# Patient Record
Sex: Male | Born: 1955 | Race: Black or African American | Hispanic: No | Marital: Single | Smoking: Never smoker
Health system: Southern US, Community
[De-identification: ages and names within clinical notes are randomized; demographics above are authoritative.]

## PROBLEM LIST (undated history)

## (undated) DIAGNOSIS — B009 Herpesviral infection, unspecified: Secondary | ICD-10-CM

## (undated) DIAGNOSIS — E785 Hyperlipidemia, unspecified: Secondary | ICD-10-CM

## (undated) DIAGNOSIS — N529 Male erectile dysfunction, unspecified: Secondary | ICD-10-CM

## (undated) HISTORY — DX: Hyperlipidemia, unspecified: E78.5

## (undated) HISTORY — DX: Male erectile dysfunction, unspecified: N52.9

## (undated) HISTORY — DX: Herpesviral infection, unspecified: B00.9

---

## 1981-07-04 HISTORY — PX: ARM WOUND REPAIR / CLOSURE: SUR1141

## 1997-07-19 ENCOUNTER — Encounter: Admission: RE | Admit: 1997-07-19 | Discharge: 1997-10-17 | Payer: Self-pay | Admitting: Family Medicine

## 2009-01-08 ENCOUNTER — Encounter: Admission: RE | Admit: 2009-01-08 | Discharge: 2009-01-08 | Payer: Self-pay | Admitting: Family Medicine

## 2011-01-20 IMAGING — CR DG LUMBAR SPINE COMPLETE 4+V
5 series · 5 of 5 positions shown · non-contrast
Comparison: None

CLINICAL DATA: Low back pain, no trauma

LUMBAR SPINE - COMPLETE 4+ VIEW

[view not recorded (1 of 5)]
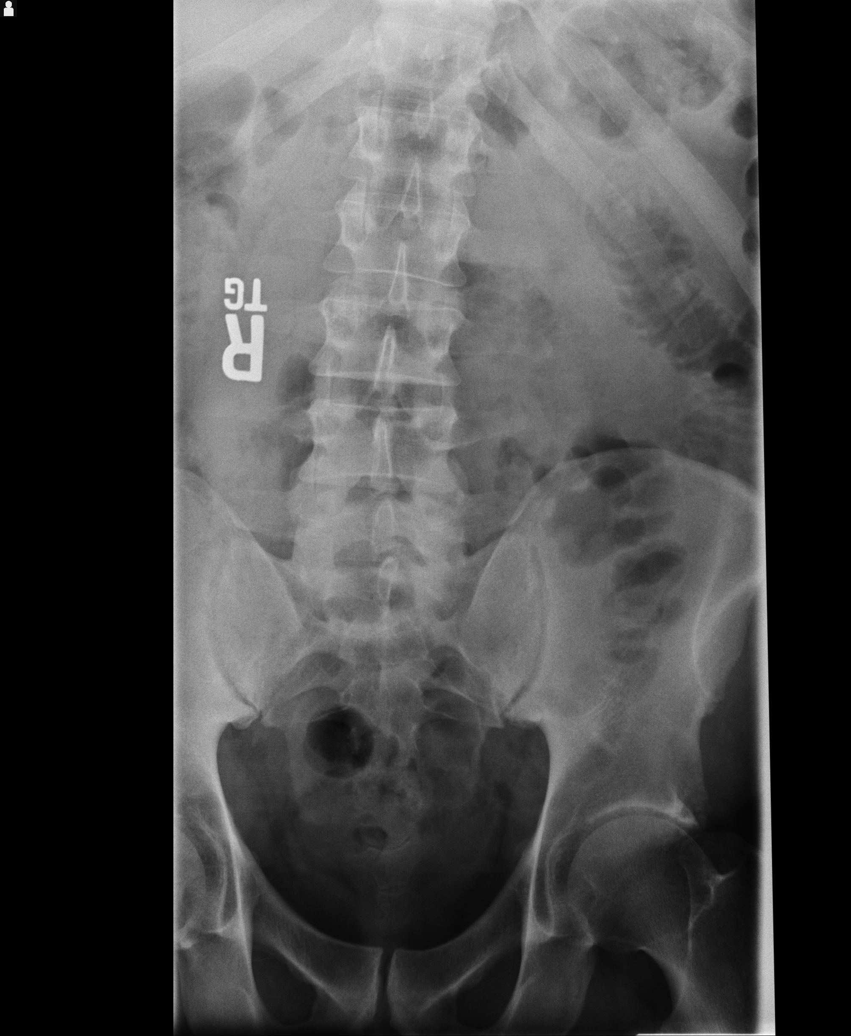

[view not recorded (2 of 5)]
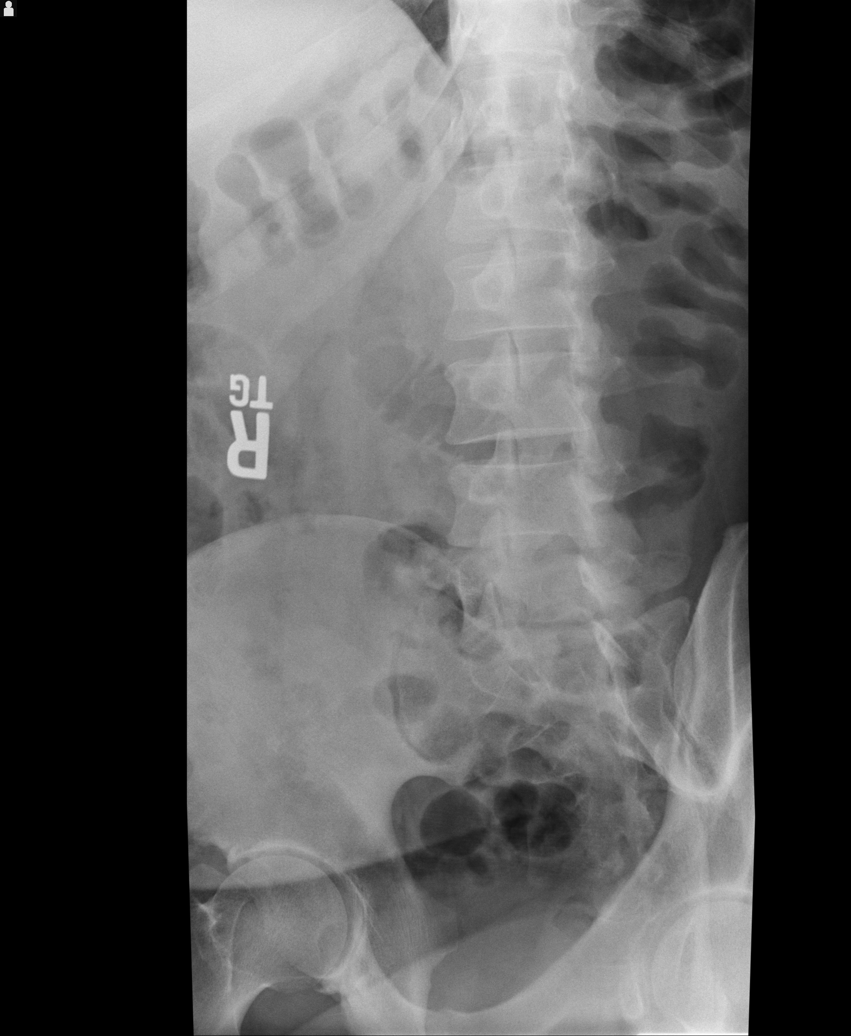

[view not recorded (3 of 5)]
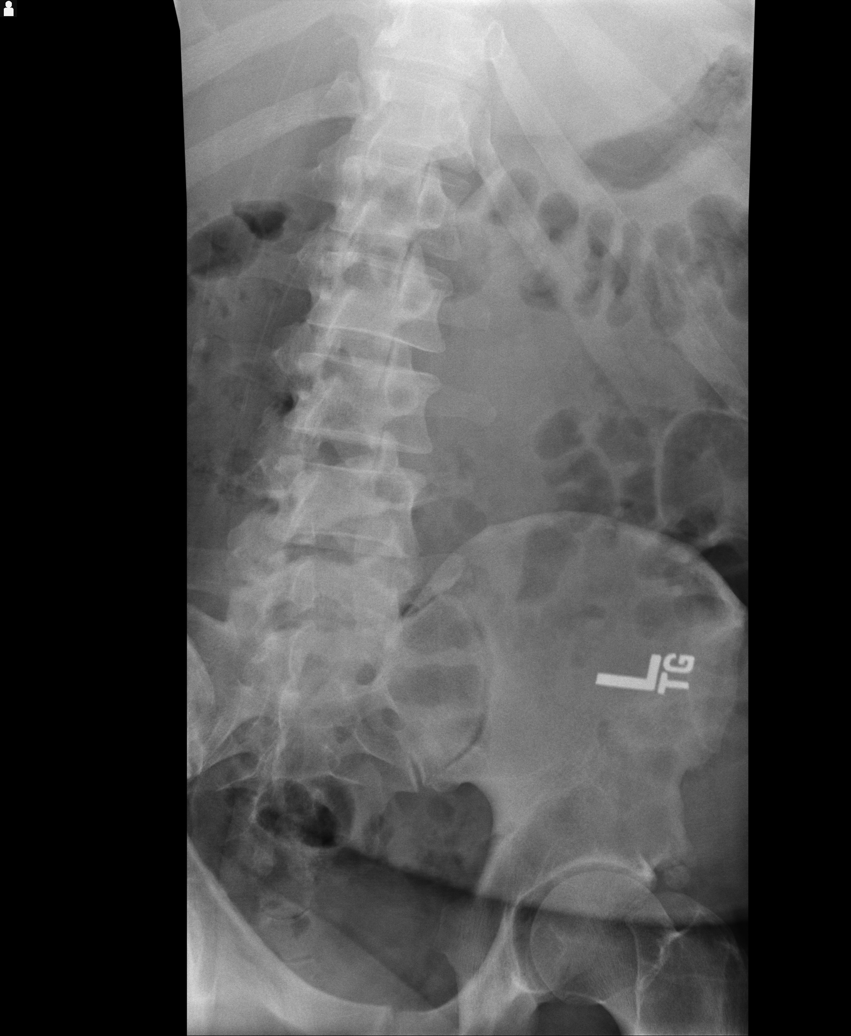

[view not recorded (4 of 5)]
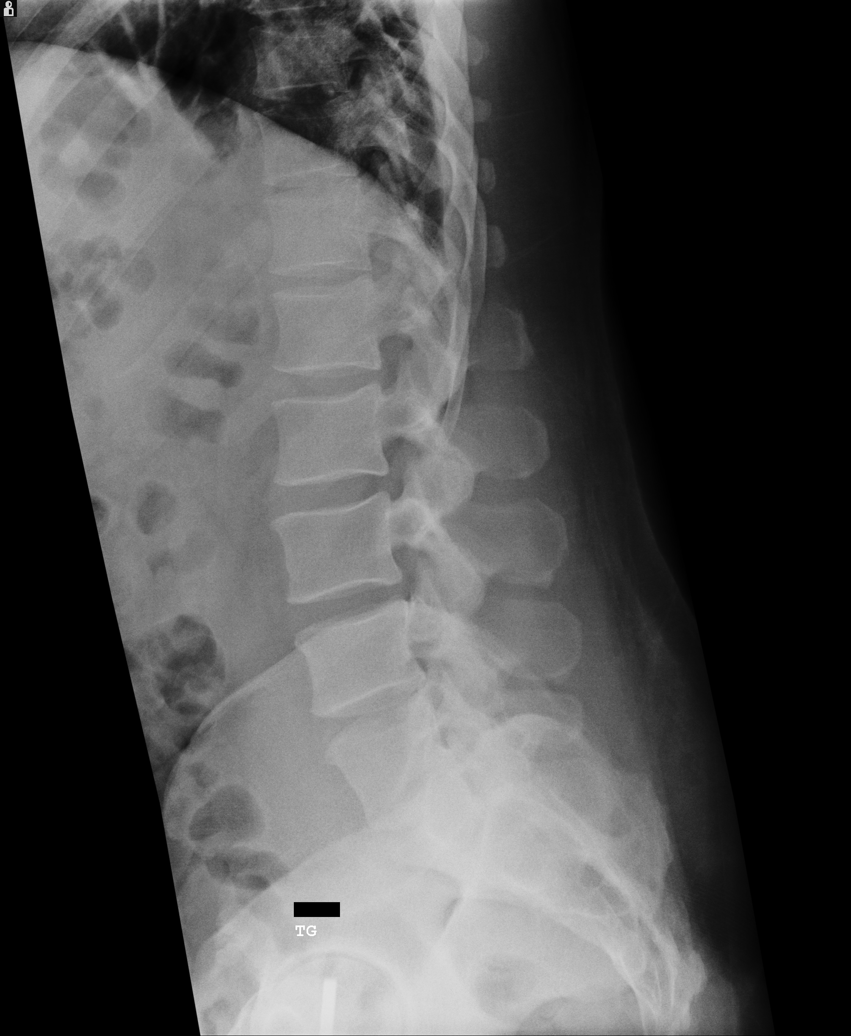

[view not recorded (5 of 5)]
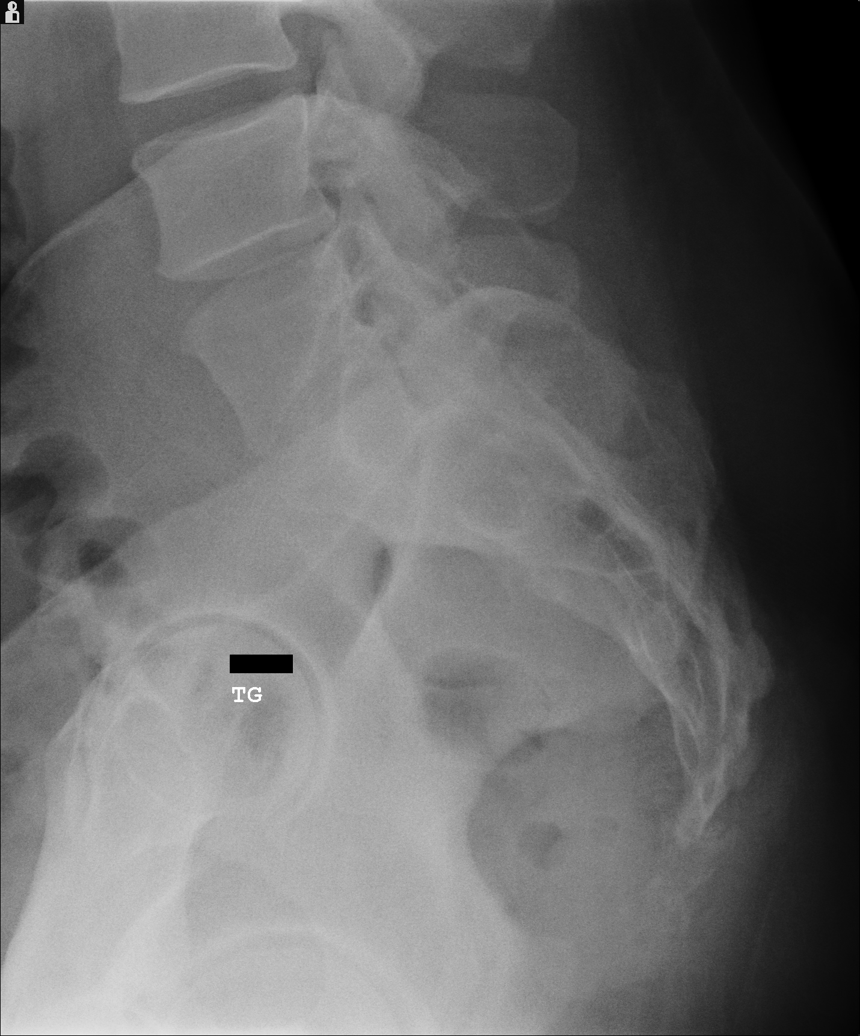

[5 of 5 positions shown; findings below may reference images not displayed]

FINDINGS: There is no evidence of lumbar spine fracture.  Alignment
is normal.  Intervertebral disc spaces are maintained.
IMPRESSION: Negative.

## 2014-01-29 ENCOUNTER — Encounter: Payer: Self-pay | Admitting: *Deleted

## 2015-09-30 DIAGNOSIS — G8929 Other chronic pain: Secondary | ICD-10-CM | POA: Diagnosis not present

## 2015-09-30 DIAGNOSIS — E785 Hyperlipidemia, unspecified: Secondary | ICD-10-CM | POA: Diagnosis not present

## 2015-09-30 DIAGNOSIS — N529 Male erectile dysfunction, unspecified: Secondary | ICD-10-CM | POA: Diagnosis not present

## 2015-09-30 DIAGNOSIS — M545 Low back pain: Secondary | ICD-10-CM | POA: Diagnosis not present

## 2016-02-21 DIAGNOSIS — R202 Paresthesia of skin: Secondary | ICD-10-CM | POA: Diagnosis not present

## 2016-02-21 DIAGNOSIS — M25511 Pain in right shoulder: Secondary | ICD-10-CM | POA: Diagnosis not present

## 2016-03-09 DIAGNOSIS — M7581 Other shoulder lesions, right shoulder: Secondary | ICD-10-CM | POA: Diagnosis not present

## 2016-03-31 DIAGNOSIS — N529 Male erectile dysfunction, unspecified: Secondary | ICD-10-CM | POA: Diagnosis not present

## 2016-03-31 DIAGNOSIS — E785 Hyperlipidemia, unspecified: Secondary | ICD-10-CM | POA: Diagnosis not present

## 2016-03-31 DIAGNOSIS — Z0000 Encounter for general adult medical examination without abnormal findings: Secondary | ICD-10-CM | POA: Diagnosis not present

## 2016-03-31 DIAGNOSIS — Z125 Encounter for screening for malignant neoplasm of prostate: Secondary | ICD-10-CM | POA: Diagnosis not present

## 2016-03-31 DIAGNOSIS — M7581 Other shoulder lesions, right shoulder: Secondary | ICD-10-CM | POA: Diagnosis not present

## 2016-03-31 DIAGNOSIS — Z23 Encounter for immunization: Secondary | ICD-10-CM | POA: Diagnosis not present

## 2016-03-31 DIAGNOSIS — M545 Low back pain: Secondary | ICD-10-CM | POA: Diagnosis not present

## 2016-03-31 DIAGNOSIS — Z1211 Encounter for screening for malignant neoplasm of colon: Secondary | ICD-10-CM | POA: Diagnosis not present

## 2016-04-14 DIAGNOSIS — M4692 Unspecified inflammatory spondylopathy, cervical region: Secondary | ICD-10-CM | POA: Diagnosis not present

## 2016-04-17 DIAGNOSIS — M47892 Other spondylosis, cervical region: Secondary | ICD-10-CM | POA: Diagnosis not present

## 2016-04-23 DIAGNOSIS — M47892 Other spondylosis, cervical region: Secondary | ICD-10-CM | POA: Diagnosis not present

## 2016-04-28 DIAGNOSIS — M47892 Other spondylosis, cervical region: Secondary | ICD-10-CM | POA: Diagnosis not present

## 2016-04-30 DIAGNOSIS — M47892 Other spondylosis, cervical region: Secondary | ICD-10-CM | POA: Diagnosis not present

## 2016-05-05 DIAGNOSIS — M47892 Other spondylosis, cervical region: Secondary | ICD-10-CM | POA: Diagnosis not present

## 2016-05-12 DIAGNOSIS — M47892 Other spondylosis, cervical region: Secondary | ICD-10-CM | POA: Diagnosis not present

## 2016-05-13 DIAGNOSIS — M542 Cervicalgia: Secondary | ICD-10-CM | POA: Diagnosis not present

## 2016-05-14 DIAGNOSIS — M47892 Other spondylosis, cervical region: Secondary | ICD-10-CM | POA: Diagnosis not present

## 2016-05-19 DIAGNOSIS — M542 Cervicalgia: Secondary | ICD-10-CM | POA: Diagnosis not present

## 2016-06-22 DIAGNOSIS — M5412 Radiculopathy, cervical region: Secondary | ICD-10-CM | POA: Diagnosis not present

## 2016-06-29 DIAGNOSIS — M5412 Radiculopathy, cervical region: Secondary | ICD-10-CM | POA: Diagnosis not present

## 2016-07-14 DIAGNOSIS — M5412 Radiculopathy, cervical region: Secondary | ICD-10-CM | POA: Diagnosis not present

## 2016-09-29 DIAGNOSIS — E7800 Pure hypercholesterolemia, unspecified: Secondary | ICD-10-CM | POA: Diagnosis not present

## 2016-09-29 DIAGNOSIS — M545 Low back pain: Secondary | ICD-10-CM | POA: Diagnosis not present

## 2016-09-29 DIAGNOSIS — N529 Male erectile dysfunction, unspecified: Secondary | ICD-10-CM | POA: Diagnosis not present

## 2016-11-03 DIAGNOSIS — R030 Elevated blood-pressure reading, without diagnosis of hypertension: Secondary | ICD-10-CM | POA: Diagnosis not present

## 2016-11-03 DIAGNOSIS — E7800 Pure hypercholesterolemia, unspecified: Secondary | ICD-10-CM | POA: Diagnosis not present

## 2016-11-03 DIAGNOSIS — M545 Low back pain: Secondary | ICD-10-CM | POA: Diagnosis not present

## 2016-11-03 DIAGNOSIS — N529 Male erectile dysfunction, unspecified: Secondary | ICD-10-CM | POA: Diagnosis not present

## 2017-05-06 DIAGNOSIS — M545 Low back pain: Secondary | ICD-10-CM | POA: Diagnosis not present

## 2017-05-06 DIAGNOSIS — Z23 Encounter for immunization: Secondary | ICD-10-CM | POA: Diagnosis not present

## 2017-05-06 DIAGNOSIS — N529 Male erectile dysfunction, unspecified: Secondary | ICD-10-CM | POA: Diagnosis not present

## 2017-05-06 DIAGNOSIS — E7800 Pure hypercholesterolemia, unspecified: Secondary | ICD-10-CM | POA: Diagnosis not present

## 2017-09-14 DIAGNOSIS — J069 Acute upper respiratory infection, unspecified: Secondary | ICD-10-CM | POA: Diagnosis not present

## 2017-09-14 DIAGNOSIS — S20361A Insect bite (nonvenomous) of right front wall of thorax, initial encounter: Secondary | ICD-10-CM | POA: Diagnosis not present

## 2017-12-06 DIAGNOSIS — M545 Low back pain: Secondary | ICD-10-CM | POA: Diagnosis not present

## 2017-12-06 DIAGNOSIS — M5432 Sciatica, left side: Secondary | ICD-10-CM | POA: Diagnosis not present

## 2017-12-06 DIAGNOSIS — N529 Male erectile dysfunction, unspecified: Secondary | ICD-10-CM | POA: Diagnosis not present

## 2017-12-06 DIAGNOSIS — Z0000 Encounter for general adult medical examination without abnormal findings: Secondary | ICD-10-CM | POA: Diagnosis not present

## 2017-12-06 DIAGNOSIS — E7800 Pure hypercholesterolemia, unspecified: Secondary | ICD-10-CM | POA: Diagnosis not present

## 2017-12-06 DIAGNOSIS — Z125 Encounter for screening for malignant neoplasm of prostate: Secondary | ICD-10-CM | POA: Diagnosis not present

## 2018-06-10 DIAGNOSIS — E7800 Pure hypercholesterolemia, unspecified: Secondary | ICD-10-CM | POA: Diagnosis not present

## 2018-06-10 DIAGNOSIS — N529 Male erectile dysfunction, unspecified: Secondary | ICD-10-CM | POA: Diagnosis not present

## 2018-06-10 DIAGNOSIS — Z1211 Encounter for screening for malignant neoplasm of colon: Secondary | ICD-10-CM | POA: Diagnosis not present

## 2018-06-10 DIAGNOSIS — M545 Low back pain: Secondary | ICD-10-CM | POA: Diagnosis not present

## 2018-06-17 DIAGNOSIS — Z1211 Encounter for screening for malignant neoplasm of colon: Secondary | ICD-10-CM | POA: Diagnosis not present

## 2018-07-07 DIAGNOSIS — Z1211 Encounter for screening for malignant neoplasm of colon: Secondary | ICD-10-CM | POA: Diagnosis not present

## 2018-09-12 DIAGNOSIS — Z01818 Encounter for other preprocedural examination: Secondary | ICD-10-CM | POA: Diagnosis not present

## 2018-09-12 DIAGNOSIS — K635 Polyp of colon: Secondary | ICD-10-CM | POA: Diagnosis not present

## 2018-09-12 DIAGNOSIS — Z1211 Encounter for screening for malignant neoplasm of colon: Secondary | ICD-10-CM | POA: Diagnosis not present

## 2018-09-21 DIAGNOSIS — K635 Polyp of colon: Secondary | ICD-10-CM | POA: Diagnosis not present

## 2018-10-13 DIAGNOSIS — K648 Other hemorrhoids: Secondary | ICD-10-CM | POA: Diagnosis not present

## 2018-10-13 DIAGNOSIS — D126 Benign neoplasm of colon, unspecified: Secondary | ICD-10-CM | POA: Diagnosis not present

## 2018-10-25 ENCOUNTER — Other Ambulatory Visit: Payer: Self-pay | Admitting: *Deleted

## 2018-10-25 DIAGNOSIS — Z20822 Contact with and (suspected) exposure to covid-19: Secondary | ICD-10-CM

## 2018-10-31 LAB — NOVEL CORONAVIRUS, NAA: SARS-CoV-2, NAA: NOT DETECTED

## 2018-11-03 ENCOUNTER — Telehealth: Payer: Self-pay | Admitting: General Practice

## 2018-11-03 NOTE — Telephone Encounter (Signed)
Patient informed of negative test results for COVID.  

## 2018-11-13 DIAGNOSIS — R58 Hemorrhage, not elsewhere classified: Secondary | ICD-10-CM | POA: Diagnosis not present

## 2018-11-26 DIAGNOSIS — S61411A Laceration without foreign body of right hand, initial encounter: Secondary | ICD-10-CM | POA: Diagnosis not present

## 2018-12-08 DIAGNOSIS — Z0000 Encounter for general adult medical examination without abnormal findings: Secondary | ICD-10-CM | POA: Diagnosis not present

## 2018-12-08 DIAGNOSIS — S61411S Laceration without foreign body of right hand, sequela: Secondary | ICD-10-CM | POA: Diagnosis not present

## 2018-12-08 DIAGNOSIS — Z125 Encounter for screening for malignant neoplasm of prostate: Secondary | ICD-10-CM | POA: Diagnosis not present

## 2018-12-08 DIAGNOSIS — M545 Low back pain: Secondary | ICD-10-CM | POA: Diagnosis not present

## 2018-12-08 DIAGNOSIS — E7800 Pure hypercholesterolemia, unspecified: Secondary | ICD-10-CM | POA: Diagnosis not present

## 2018-12-08 DIAGNOSIS — N529 Male erectile dysfunction, unspecified: Secondary | ICD-10-CM | POA: Diagnosis not present

## 2019-02-03 DIAGNOSIS — S91311A Laceration without foreign body, right foot, initial encounter: Secondary | ICD-10-CM | POA: Diagnosis not present

## 2019-02-10 DIAGNOSIS — Z23 Encounter for immunization: Secondary | ICD-10-CM | POA: Diagnosis not present

## 2019-05-02 DIAGNOSIS — Z20822 Contact with and (suspected) exposure to covid-19: Secondary | ICD-10-CM | POA: Diagnosis not present

## 2019-05-02 DIAGNOSIS — U071 COVID-19: Secondary | ICD-10-CM | POA: Diagnosis not present

## 2019-05-02 DIAGNOSIS — Z1152 Encounter for screening for COVID-19: Secondary | ICD-10-CM | POA: Diagnosis not present

## 2019-05-04 DIAGNOSIS — U071 COVID-19: Secondary | ICD-10-CM | POA: Diagnosis not present

## 2019-05-04 DIAGNOSIS — Z9189 Other specified personal risk factors, not elsewhere classified: Secondary | ICD-10-CM | POA: Diagnosis not present

## 2019-05-11 DIAGNOSIS — Z20822 Contact with and (suspected) exposure to covid-19: Secondary | ICD-10-CM | POA: Diagnosis not present

## 2019-06-12 DIAGNOSIS — M545 Low back pain: Secondary | ICD-10-CM | POA: Diagnosis not present

## 2019-06-12 DIAGNOSIS — E7800 Pure hypercholesterolemia, unspecified: Secondary | ICD-10-CM | POA: Diagnosis not present

## 2019-06-12 DIAGNOSIS — N529 Male erectile dysfunction, unspecified: Secondary | ICD-10-CM | POA: Diagnosis not present

## 2019-08-01 DIAGNOSIS — E7800 Pure hypercholesterolemia, unspecified: Secondary | ICD-10-CM | POA: Diagnosis not present

## 2019-12-11 DIAGNOSIS — E7800 Pure hypercholesterolemia, unspecified: Secondary | ICD-10-CM | POA: Diagnosis not present

## 2019-12-11 DIAGNOSIS — Z125 Encounter for screening for malignant neoplasm of prostate: Secondary | ICD-10-CM | POA: Diagnosis not present

## 2019-12-11 DIAGNOSIS — Z0000 Encounter for general adult medical examination without abnormal findings: Secondary | ICD-10-CM | POA: Diagnosis not present

## 2020-04-26 DIAGNOSIS — R059 Cough, unspecified: Secondary | ICD-10-CM | POA: Diagnosis not present

## 2020-04-26 DIAGNOSIS — M545 Low back pain, unspecified: Secondary | ICD-10-CM | POA: Diagnosis not present

## 2020-04-26 DIAGNOSIS — U071 COVID-19: Secondary | ICD-10-CM | POA: Diagnosis not present

## 2020-06-13 DIAGNOSIS — N529 Male erectile dysfunction, unspecified: Secondary | ICD-10-CM | POA: Diagnosis not present

## 2020-06-13 DIAGNOSIS — M545 Low back pain, unspecified: Secondary | ICD-10-CM | POA: Diagnosis not present

## 2020-06-13 DIAGNOSIS — E7800 Pure hypercholesterolemia, unspecified: Secondary | ICD-10-CM | POA: Diagnosis not present

## 2020-06-13 DIAGNOSIS — Z8616 Personal history of COVID-19: Secondary | ICD-10-CM | POA: Diagnosis not present

## 2020-12-13 DIAGNOSIS — N529 Male erectile dysfunction, unspecified: Secondary | ICD-10-CM | POA: Diagnosis not present

## 2020-12-13 DIAGNOSIS — Z125 Encounter for screening for malignant neoplasm of prostate: Secondary | ICD-10-CM | POA: Diagnosis not present

## 2020-12-13 DIAGNOSIS — Z0000 Encounter for general adult medical examination without abnormal findings: Secondary | ICD-10-CM | POA: Diagnosis not present

## 2020-12-13 DIAGNOSIS — M1710 Unilateral primary osteoarthritis, unspecified knee: Secondary | ICD-10-CM | POA: Diagnosis not present

## 2020-12-13 DIAGNOSIS — M545 Low back pain, unspecified: Secondary | ICD-10-CM | POA: Diagnosis not present

## 2020-12-13 DIAGNOSIS — E7800 Pure hypercholesterolemia, unspecified: Secondary | ICD-10-CM | POA: Diagnosis not present

## 2021-06-20 DIAGNOSIS — E7800 Pure hypercholesterolemia, unspecified: Secondary | ICD-10-CM | POA: Diagnosis not present

## 2021-06-20 DIAGNOSIS — M545 Low back pain, unspecified: Secondary | ICD-10-CM | POA: Diagnosis not present

## 2021-06-20 DIAGNOSIS — M1712 Unilateral primary osteoarthritis, left knee: Secondary | ICD-10-CM | POA: Diagnosis not present

## 2021-06-20 DIAGNOSIS — N529 Male erectile dysfunction, unspecified: Secondary | ICD-10-CM | POA: Diagnosis not present

## 2022-01-02 DIAGNOSIS — M1712 Unilateral primary osteoarthritis, left knee: Secondary | ICD-10-CM | POA: Diagnosis not present

## 2022-01-02 DIAGNOSIS — Z125 Encounter for screening for malignant neoplasm of prostate: Secondary | ICD-10-CM | POA: Diagnosis not present

## 2022-01-02 DIAGNOSIS — N529 Male erectile dysfunction, unspecified: Secondary | ICD-10-CM | POA: Diagnosis not present

## 2022-01-02 DIAGNOSIS — M1711 Unilateral primary osteoarthritis, right knee: Secondary | ICD-10-CM | POA: Diagnosis not present

## 2022-01-02 DIAGNOSIS — E7800 Pure hypercholesterolemia, unspecified: Secondary | ICD-10-CM | POA: Diagnosis not present

## 2022-01-02 DIAGNOSIS — Z0000 Encounter for general adult medical examination without abnormal findings: Secondary | ICD-10-CM | POA: Diagnosis not present

## 2022-01-02 DIAGNOSIS — M545 Low back pain, unspecified: Secondary | ICD-10-CM | POA: Diagnosis not present

## 2022-02-20 DIAGNOSIS — M25561 Pain in right knee: Secondary | ICD-10-CM | POA: Diagnosis not present

## 2022-02-20 DIAGNOSIS — M25562 Pain in left knee: Secondary | ICD-10-CM | POA: Diagnosis not present

## 2022-02-20 DIAGNOSIS — M170 Bilateral primary osteoarthritis of knee: Secondary | ICD-10-CM | POA: Diagnosis not present

## 2022-07-02 DIAGNOSIS — N529 Male erectile dysfunction, unspecified: Secondary | ICD-10-CM | POA: Diagnosis not present

## 2022-07-02 DIAGNOSIS — E7800 Pure hypercholesterolemia, unspecified: Secondary | ICD-10-CM | POA: Diagnosis not present

## 2022-07-02 DIAGNOSIS — M545 Low back pain, unspecified: Secondary | ICD-10-CM | POA: Diagnosis not present

## 2022-07-02 DIAGNOSIS — M1710 Unilateral primary osteoarthritis, unspecified knee: Secondary | ICD-10-CM | POA: Diagnosis not present

## 2023-01-15 DIAGNOSIS — N529 Male erectile dysfunction, unspecified: Secondary | ICD-10-CM | POA: Diagnosis not present

## 2023-01-15 DIAGNOSIS — M1710 Unilateral primary osteoarthritis, unspecified knee: Secondary | ICD-10-CM | POA: Diagnosis not present

## 2023-01-15 DIAGNOSIS — Z0000 Encounter for general adult medical examination without abnormal findings: Secondary | ICD-10-CM | POA: Diagnosis not present

## 2023-01-15 DIAGNOSIS — Z125 Encounter for screening for malignant neoplasm of prostate: Secondary | ICD-10-CM | POA: Diagnosis not present

## 2023-01-15 DIAGNOSIS — E7800 Pure hypercholesterolemia, unspecified: Secondary | ICD-10-CM | POA: Diagnosis not present

## 2023-01-15 DIAGNOSIS — Z23 Encounter for immunization: Secondary | ICD-10-CM | POA: Diagnosis not present

## 2023-01-15 DIAGNOSIS — M545 Low back pain, unspecified: Secondary | ICD-10-CM | POA: Diagnosis not present

## 2023-02-25 DIAGNOSIS — M1711 Unilateral primary osteoarthritis, right knee: Secondary | ICD-10-CM | POA: Diagnosis not present

## 2023-05-22 HISTORY — PX: TOTAL KNEE ARTHROPLASTY: SHX125

## 2023-06-07 DIAGNOSIS — Z0189 Encounter for other specified special examinations: Secondary | ICD-10-CM | POA: Diagnosis not present

## 2023-06-15 DIAGNOSIS — G8918 Other acute postprocedural pain: Secondary | ICD-10-CM | POA: Diagnosis not present

## 2023-06-15 DIAGNOSIS — M25761 Osteophyte, right knee: Secondary | ICD-10-CM | POA: Diagnosis not present

## 2023-06-15 DIAGNOSIS — M1711 Unilateral primary osteoarthritis, right knee: Secondary | ICD-10-CM | POA: Diagnosis not present

## 2023-06-17 DIAGNOSIS — M25661 Stiffness of right knee, not elsewhere classified: Secondary | ICD-10-CM | POA: Diagnosis not present

## 2023-06-17 DIAGNOSIS — M25561 Pain in right knee: Secondary | ICD-10-CM | POA: Diagnosis not present

## 2023-06-21 DIAGNOSIS — M25661 Stiffness of right knee, not elsewhere classified: Secondary | ICD-10-CM | POA: Diagnosis not present

## 2023-06-21 DIAGNOSIS — M25561 Pain in right knee: Secondary | ICD-10-CM | POA: Diagnosis not present

## 2023-06-23 DIAGNOSIS — M25561 Pain in right knee: Secondary | ICD-10-CM | POA: Diagnosis not present

## 2023-06-23 DIAGNOSIS — M25661 Stiffness of right knee, not elsewhere classified: Secondary | ICD-10-CM | POA: Diagnosis not present

## 2023-06-23 DIAGNOSIS — M542 Cervicalgia: Secondary | ICD-10-CM | POA: Diagnosis not present

## 2023-06-25 DIAGNOSIS — M542 Cervicalgia: Secondary | ICD-10-CM | POA: Diagnosis not present

## 2023-06-25 DIAGNOSIS — M25661 Stiffness of right knee, not elsewhere classified: Secondary | ICD-10-CM | POA: Diagnosis not present

## 2023-06-25 DIAGNOSIS — M25561 Pain in right knee: Secondary | ICD-10-CM | POA: Diagnosis not present

## 2023-06-28 DIAGNOSIS — M542 Cervicalgia: Secondary | ICD-10-CM | POA: Diagnosis not present

## 2023-06-28 DIAGNOSIS — M25661 Stiffness of right knee, not elsewhere classified: Secondary | ICD-10-CM | POA: Diagnosis not present

## 2023-06-28 DIAGNOSIS — M25561 Pain in right knee: Secondary | ICD-10-CM | POA: Diagnosis not present

## 2023-06-30 DIAGNOSIS — M25561 Pain in right knee: Secondary | ICD-10-CM | POA: Diagnosis not present

## 2023-06-30 DIAGNOSIS — M25661 Stiffness of right knee, not elsewhere classified: Secondary | ICD-10-CM | POA: Diagnosis not present

## 2023-06-30 DIAGNOSIS — M542 Cervicalgia: Secondary | ICD-10-CM | POA: Diagnosis not present

## 2023-07-02 DIAGNOSIS — M542 Cervicalgia: Secondary | ICD-10-CM | POA: Diagnosis not present

## 2023-07-02 DIAGNOSIS — M25661 Stiffness of right knee, not elsewhere classified: Secondary | ICD-10-CM | POA: Diagnosis not present

## 2023-07-02 DIAGNOSIS — M25561 Pain in right knee: Secondary | ICD-10-CM | POA: Diagnosis not present

## 2023-07-05 DIAGNOSIS — M25561 Pain in right knee: Secondary | ICD-10-CM | POA: Diagnosis not present

## 2023-07-05 DIAGNOSIS — M542 Cervicalgia: Secondary | ICD-10-CM | POA: Diagnosis not present

## 2023-07-05 DIAGNOSIS — M25661 Stiffness of right knee, not elsewhere classified: Secondary | ICD-10-CM | POA: Diagnosis not present

## 2023-07-07 DIAGNOSIS — M25661 Stiffness of right knee, not elsewhere classified: Secondary | ICD-10-CM | POA: Diagnosis not present

## 2023-07-07 DIAGNOSIS — M542 Cervicalgia: Secondary | ICD-10-CM | POA: Diagnosis not present

## 2023-07-07 DIAGNOSIS — M25561 Pain in right knee: Secondary | ICD-10-CM | POA: Diagnosis not present

## 2023-07-13 DIAGNOSIS — M25561 Pain in right knee: Secondary | ICD-10-CM | POA: Diagnosis not present

## 2023-07-13 DIAGNOSIS — M25661 Stiffness of right knee, not elsewhere classified: Secondary | ICD-10-CM | POA: Diagnosis not present

## 2023-07-16 DIAGNOSIS — M25661 Stiffness of right knee, not elsewhere classified: Secondary | ICD-10-CM | POA: Diagnosis not present

## 2023-07-16 DIAGNOSIS — M542 Cervicalgia: Secondary | ICD-10-CM | POA: Diagnosis not present

## 2023-07-16 DIAGNOSIS — M25561 Pain in right knee: Secondary | ICD-10-CM | POA: Diagnosis not present

## 2023-07-20 DIAGNOSIS — Z5189 Encounter for other specified aftercare: Secondary | ICD-10-CM | POA: Diagnosis not present

## 2023-07-20 DIAGNOSIS — M25661 Stiffness of right knee, not elsewhere classified: Secondary | ICD-10-CM | POA: Diagnosis not present

## 2023-07-20 DIAGNOSIS — M25561 Pain in right knee: Secondary | ICD-10-CM | POA: Diagnosis not present

## 2023-07-23 DIAGNOSIS — M25661 Stiffness of right knee, not elsewhere classified: Secondary | ICD-10-CM | POA: Diagnosis not present

## 2023-07-23 DIAGNOSIS — M25561 Pain in right knee: Secondary | ICD-10-CM | POA: Diagnosis not present

## 2023-07-27 DIAGNOSIS — M25661 Stiffness of right knee, not elsewhere classified: Secondary | ICD-10-CM | POA: Diagnosis not present

## 2023-07-27 DIAGNOSIS — M25561 Pain in right knee: Secondary | ICD-10-CM | POA: Diagnosis not present

## 2023-07-30 DIAGNOSIS — M25661 Stiffness of right knee, not elsewhere classified: Secondary | ICD-10-CM | POA: Diagnosis not present

## 2023-07-30 DIAGNOSIS — M25561 Pain in right knee: Secondary | ICD-10-CM | POA: Diagnosis not present

## 2023-08-02 DIAGNOSIS — Z9660 Presence of unspecified orthopedic joint implant: Secondary | ICD-10-CM | POA: Diagnosis not present

## 2023-08-02 DIAGNOSIS — M545 Low back pain, unspecified: Secondary | ICD-10-CM | POA: Diagnosis not present

## 2023-08-02 DIAGNOSIS — E7800 Pure hypercholesterolemia, unspecified: Secondary | ICD-10-CM | POA: Diagnosis not present

## 2023-08-02 DIAGNOSIS — N529 Male erectile dysfunction, unspecified: Secondary | ICD-10-CM | POA: Diagnosis not present

## 2023-08-03 DIAGNOSIS — M25661 Stiffness of right knee, not elsewhere classified: Secondary | ICD-10-CM | POA: Diagnosis not present

## 2023-08-03 DIAGNOSIS — M25561 Pain in right knee: Secondary | ICD-10-CM | POA: Diagnosis not present

## 2023-08-06 DIAGNOSIS — M25561 Pain in right knee: Secondary | ICD-10-CM | POA: Diagnosis not present

## 2023-08-06 DIAGNOSIS — M25661 Stiffness of right knee, not elsewhere classified: Secondary | ICD-10-CM | POA: Diagnosis not present

## 2023-08-10 DIAGNOSIS — M25561 Pain in right knee: Secondary | ICD-10-CM | POA: Diagnosis not present

## 2023-08-10 DIAGNOSIS — M25661 Stiffness of right knee, not elsewhere classified: Secondary | ICD-10-CM | POA: Diagnosis not present

## 2023-08-13 DIAGNOSIS — N471 Phimosis: Secondary | ICD-10-CM | POA: Diagnosis not present

## 2023-08-13 DIAGNOSIS — M25561 Pain in right knee: Secondary | ICD-10-CM | POA: Diagnosis not present

## 2023-08-13 DIAGNOSIS — M25661 Stiffness of right knee, not elsewhere classified: Secondary | ICD-10-CM | POA: Diagnosis not present

## 2023-08-16 ENCOUNTER — Other Ambulatory Visit: Payer: Self-pay | Admitting: Urology

## 2023-08-20 ENCOUNTER — Other Ambulatory Visit: Payer: Self-pay

## 2023-08-20 ENCOUNTER — Encounter (HOSPITAL_COMMUNITY): Payer: Self-pay | Admitting: Urology

## 2023-08-20 NOTE — Progress Notes (Signed)
 PCP - Dr. Rae Bugler Cardiologist - denies  PPM/ICD - denies   Chest x-ray - denies EKG - denies Stress Test - denies ECHO - denies Cardiac Cath - denies  CPAP - denies  DM- denies  ASA/Blood Thinner Instructions: n/a   ERAS Protcol - no, NPO  COVID TEST- n/a  Anesthesia review: no  Patient verbally denies any shortness of breath, fever, cough and chest pain during phone call      Questions were answered. Patient verbalized understanding of instructions.

## 2023-08-20 NOTE — H&P (Signed)
 CC/HPI: cc: phimosis   08/13/23: 68 yo man with phimosis. He has been experiencing difficulty retracting foreskin. When he is able to retract it it is intermittently red and irritated. He recently had a right knee replacement. He is interested in moving forward with a circumcision. He has been thinking about this for years. He does not take any blood thinners.     ALLERGIES: No Known Allergies    MEDICATIONS: HYDROcodone-Acetaminophen 10-325 MG Tablet  Naproxen Sodium 220 MG Capsule  Sildenafil Citrate 20 MG Tablet     GU PSH: None     PSH Notes: Arm surgery    NON-GU PSH: Knee replacement - about 05/22/2023     GU PMH: None     PMH Notes: HSV type 11   NON-GU PMH: Arthritis Hypercholesterolemia    FAMILY HISTORY: None   SOCIAL HISTORY: Marital Status: Married Preferred Language: English; Ethnicity: Not Hispanic Or Latino; Race: Black or African American Current Smoking Status: Patient has never smoked.   Tobacco Use Assessment Completed: Used Tobacco in last 30 days? Does not drink anymore.  Drinks 2 caffeinated drinks per day.    REVIEW OF SYSTEMS:    GU Review Male:   Patient denies frequent urination, hard to postpone urination, burning/ pain with urination, get up at night to urinate, leakage of urine, stream starts and stops, trouble starting your stream, have to strain to urinate , erection problems, and penile pain.  Gastrointestinal (Upper):   Patient denies nausea, vomiting, and indigestion/ heartburn.  Gastrointestinal (Lower):   Patient denies diarrhea and constipation.  Constitutional:   Patient denies fever, night sweats, weight loss, and fatigue.  Skin:   Patient denies skin rash/ lesion and itching.  Eyes:   Patient denies blurred vision and double vision.  Ears/ Nose/ Throat:   Patient denies sore throat and sinus problems.  Hematologic/Lymphatic:   Patient denies swollen glands and easy bruising.  Cardiovascular:   Patient denies leg swelling and chest  pains.  Respiratory:   Patient denies cough and shortness of breath.  Endocrine:   Patient denies excessive thirst.  Musculoskeletal:   Patient reports joint pain. Patient denies back pain.  Neurological:   Patient denies headaches and dizziness.  Psychologic:   Patient denies depression and anxiety.   VITAL SIGNS:      08/13/2023 02:53 PM  Weight 126 lb / 57.15 kg  Height 70 in / 177.8 cm  BP 138/88 mmHg  Pulse 71 /min  Temperature 97.7 F / 36.5 C  BMI 18.1 kg/m   GU PHYSICAL EXAMINATION:      Notes: uncirc phallus, tight phimotic foreskin, difficult retracting, meatus normal appearing   MULTI-SYSTEM PHYSICAL EXAMINATION:    Constitutional: Well-nourished. No physical deformities. Normally developed. Good grooming.  Neck: Neck symmetrical, not swollen. Normal tracheal position.  Respiratory: No labored breathing, no use of accessory muscles.   Skin: No paleness, no jaundice, no cyanosis. No lesion, no ulcer, no rash.  Neurologic / Psychiatric: Oriented to time, oriented to place, oriented to person. No depression, no anxiety, no agitation.  Eyes: Normal conjunctivae. Normal eyelids.  Ears, Nose, Mouth, and Throat: Left ear no scars, no lesions, no masses. Right ear no scars, no lesions, no masses. Nose no scars, no lesions, no masses. Normal hearing. Normal lips.  Musculoskeletal: Normal gait and station of head and neck.     Complexity of Data:  Records Review:   Previous Patient Records, POC Tool  Urine Test Review:   Urinalysis  PROCEDURES:          Urinalysis w/Scope Dipstick Dipstick Cont'd Micro  Color: Amber Bilirubin: Neg mg/dL WBC/hpf: 0 - 5/hpf  Appearance: Clear Ketones: Neg mg/dL RBC/hpf: 0 - 2/hpf  Specific Gravity: 1.025 Blood: Neg ery/uL Bacteria: Rare (0-9/hpf)  pH: 6.0 Protein: Trace mg/dL Cystals: NS (Not Seen)  Glucose: Neg mg/dL Urobilinogen: 1.0 mg/dL Casts: NS (Not Seen)    Nitrites: Neg Trichomonas: Not Present    Leukocyte Esterase: Trace leu/uL  Mucous: Present      Epithelial Cells: 0 - 5/hpf      Yeast: NS (Not Seen)      Sperm: Not Present    ASSESSMENT:      ICD-10 Details  1 GU:   Phimosis - N47.1 Chronic, Worsening   PLAN:           Document Letter(s):  Created for Patient: Clinical Summary         Notes:   1. Phimosis:  - We discussed moving forward with a circumcision. Risks and benefits of circumcision discussed with patient including but not limited to pain, bleeding, infection, poor cosmetic appearance, increase in glans sensitivity, damage to surrounding structures   schedule next available circumcision

## 2023-08-20 NOTE — Pre-Procedure Instructions (Signed)
-------------    SDW INSTRUCTIONS given:  Your procedure is scheduled on 5/6.  Report to [facility] Main Entrance "A" at 10:00 A.M., and check in at the Admitting office.  Any questions or running late day of surgery: call [phone]    Remember:  Do not eat or drink after midnight the night before your surgery     Take these medicines the morning of surgery with A SIP OF WATER  May take these medicines IF NEEDED: Norco  As of today, STOP taking any Aspirin (unless otherwise instructed by your surgeon) Aleve, Naproxen, Ibuprofen, Motrin, Advil, Goody's, BC's, all herbal medications, fish oil, and all vitamins.   Do NOT Smoke (Tobacco/Vaping) 24 hours prior to your procedure  If you use a CPAP at night, you may bring all equipment for your overnight stay.     You will be asked to remove any contacts, glasses, piercing's, hearing aid's, dentures/partials prior to surgery. Please bring cases for these items if needed.     Patients discharged the day of surgery will not be allowed to drive home, and someone needs to stay with them for 24 hours.  SURGICAL WAITING ROOM VISITATION Patients may have no more than 2 support people in the waiting area - these visitors may rotate.   Pre-op nurse will coordinate an appropriate time for 1 ADULT support person, who may not rotate, to accompany patient in pre-op.  Children under the age of 40 must have an adult with them who is not the patient and must remain in the main waiting area with an adult.  If the patient needs to stay at the hospital during part of their recovery, the visitor guidelines for inpatient rooms apply.  Please refer to the [facility] website for the visitor guidelines for any additional information.   Special instructions:   [facility]- Preparing For Surgery   Please follow these instructions carefully.   Shower the NIGHT BEFORE SURGERY and the MORNING OF SURGERY with DIAL Soap.   Pat yourself dry with a CLEAN  TOWEL.  Wear CLEAN PAJAMAS to bed the night before surgery  Place CLEAN SHEETS on your bed the night of your first shower and DO NOT SLEEP WITH PETS.   Additional instructions for the day of surgery: DO NOT APPLY any lotions, deodorants, cologne, or perfumes.   Do not wear jewelry or makeup Do not wear nail polish, gel polish, artificial nails, or any other type of covering on natural nails (fingers and toes) Do not bring valuables to the hospital. [facility] is not responsible for valuables/personal belongings. Put on clean/comfortable clothes.  Please brush your teeth.  Ask your nurse before applying any prescription medications to the skin.

## 2023-08-24 ENCOUNTER — Encounter (HOSPITAL_COMMUNITY)
Admission: RE | Disposition: A | Discharge status: Home / Self Care | Payer: Self-pay | Source: Home / Self Care | Attending: Urology

## 2023-08-24 ENCOUNTER — Encounter (HOSPITAL_COMMUNITY): Payer: Self-pay | Admitting: Urology

## 2023-08-24 ENCOUNTER — Other Ambulatory Visit: Payer: Self-pay

## 2023-08-24 ENCOUNTER — Ambulatory Visit (HOSPITAL_COMMUNITY): Payer: Self-pay | Admitting: Anesthesiology

## 2023-08-24 ENCOUNTER — Ambulatory Visit (HOSPITAL_COMMUNITY)
Admission: RE | Admit: 2023-08-24 | Discharge: 2023-08-24 | Disposition: A | Discharge status: Home / Self Care | Payer: Self-pay | Attending: Urology | Admitting: Urology

## 2023-08-24 DIAGNOSIS — N471 Phimosis: Secondary | ICD-10-CM | POA: Insufficient documentation

## 2023-08-24 DIAGNOSIS — Z96651 Presence of right artificial knee joint: Secondary | ICD-10-CM | POA: Insufficient documentation

## 2023-08-24 DIAGNOSIS — M1990 Unspecified osteoarthritis, unspecified site: Secondary | ICD-10-CM | POA: Diagnosis not present

## 2023-08-24 HISTORY — PX: CIRCUMCISION: SHX1350

## 2023-08-24 LAB — CBC
HCT: 41.6 % (ref 39.0–52.0)
Hemoglobin: 13.8 g/dL (ref 13.0–17.0)
MCH: 29.7 pg (ref 26.0–34.0)
MCHC: 33.2 g/dL (ref 30.0–36.0)
MCV: 89.7 fL (ref 80.0–100.0)
Platelets: 167 10*3/uL (ref 150–400)
RBC: 4.64 MIL/uL (ref 4.22–5.81)
RDW: 13.7 % (ref 11.5–15.5)
WBC: 3.5 10*3/uL — ABNORMAL LOW (ref 4.0–10.5)
nRBC: 0 % (ref 0.0–0.2)

## 2023-08-24 SURGERY — CIRCUMCISION, ADULT
Anesthesia: General | Site: Penis

## 2023-08-24 MED ORDER — ONDANSETRON HCL 4 MG/2ML IJ SOLN
INTRAMUSCULAR | Status: DC | PRN
  Administered 2023-08-24: 4 mg via INTRAVENOUS

## 2023-08-24 MED ORDER — LABETALOL HCL 5 MG/ML IV SOLN
INTRAVENOUS | Status: AC
  Filled 2023-08-24: qty 4

## 2023-08-24 MED ORDER — LIDOCAINE HCL 1 % IJ SOLN
INTRAMUSCULAR | Status: AC
  Filled 2023-08-24: qty 20

## 2023-08-24 MED ORDER — FENTANYL CITRATE (PF) 100 MCG/2ML IJ SOLN
INTRAMUSCULAR | Status: AC
  Filled 2023-08-24: qty 2

## 2023-08-24 MED ORDER — ONDANSETRON HCL 4 MG/2ML IJ SOLN
INTRAMUSCULAR | Status: AC
  Filled 2023-08-24: qty 2

## 2023-08-24 MED ORDER — PROPOFOL 10 MG/ML IV BOLUS
INTRAVENOUS | Status: AC
  Filled 2023-08-24: qty 20

## 2023-08-24 MED ORDER — PHENYLEPHRINE 80 MCG/ML (10ML) SYRINGE FOR IV PUSH (FOR BLOOD PRESSURE SUPPORT)
PREFILLED_SYRINGE | INTRAVENOUS | Status: AC
  Filled 2023-08-24: qty 10

## 2023-08-24 MED ORDER — CHLORHEXIDINE GLUCONATE 0.12 % MT SOLN
15.0000 mL | Freq: Once | OROMUCOSAL | Status: AC
  Administered 2023-08-24: 15 mL via OROMUCOSAL
  Filled 2023-08-24: qty 15

## 2023-08-24 MED ORDER — LIDOCAINE 2% (20 MG/ML) 5 ML SYRINGE
INTRAMUSCULAR | Status: AC
  Filled 2023-08-24: qty 5

## 2023-08-24 MED ORDER — CEFAZOLIN SODIUM-DEXTROSE 2-4 GM/100ML-% IV SOLN
2.0000 g | INTRAVENOUS | Status: AC
  Administered 2023-08-24: 2 g via INTRAVENOUS
  Filled 2023-08-24: qty 100

## 2023-08-24 MED ORDER — BUPIVACAINE HCL (PF) 0.25 % IJ SOLN
INTRAMUSCULAR | Status: AC
  Filled 2023-08-24: qty 30

## 2023-08-24 MED ORDER — PHENYLEPHRINE 80 MCG/ML (10ML) SYRINGE FOR IV PUSH (FOR BLOOD PRESSURE SUPPORT)
PREFILLED_SYRINGE | INTRAVENOUS | Status: DC | PRN
Start: 2023-08-24 — End: 2023-08-24
  Administered 2023-08-24 (×8): 160 ug via INTRAVENOUS

## 2023-08-24 MED ORDER — LACTATED RINGERS IV SOLN
INTRAVENOUS | Status: DC
Start: 2023-08-24 — End: 2023-08-24

## 2023-08-24 MED ORDER — BACITRACIN ZINC 500 UNIT/GM EX OINT
TOPICAL_OINTMENT | CUTANEOUS | Status: AC
  Filled 2023-08-24: qty 28.35

## 2023-08-24 MED ORDER — MIDAZOLAM HCL 2 MG/2ML IJ SOLN
INTRAMUSCULAR | Status: DC | PRN
  Administered 2023-08-24: 2 mg via INTRAVENOUS

## 2023-08-24 MED ORDER — LIDOCAINE HCL 1 % IJ SOLN
INTRAMUSCULAR | Status: DC | PRN
  Administered 2023-08-24: 20 mL via INTRADERMAL

## 2023-08-24 MED ORDER — DEXAMETHASONE SODIUM PHOSPHATE 10 MG/ML IJ SOLN
INTRAMUSCULAR | Status: DC | PRN
  Administered 2023-08-24: 10 mg via INTRAVENOUS

## 2023-08-24 MED ORDER — PROPOFOL 10 MG/ML IV BOLUS
INTRAVENOUS | Status: DC | PRN
  Administered 2023-08-24: 200 mg via INTRAVENOUS

## 2023-08-24 MED ORDER — DEXAMETHASONE SODIUM PHOSPHATE 10 MG/ML IJ SOLN
INTRAMUSCULAR | Status: AC
  Filled 2023-08-24: qty 1

## 2023-08-24 MED ORDER — LIDOCAINE HCL (CARDIAC) PF 100 MG/5ML IV SOSY
PREFILLED_SYRINGE | INTRAVENOUS | Status: DC | PRN
  Administered 2023-08-24: 100 mg via INTRATRACHEAL

## 2023-08-24 MED ORDER — MIDAZOLAM HCL 2 MG/2ML IJ SOLN
INTRAMUSCULAR | Status: AC
  Filled 2023-08-24: qty 2

## 2023-08-24 MED ORDER — ORAL CARE MOUTH RINSE: 15.0000 mL | Freq: Once | OROMUCOSAL | Status: AC

## 2023-08-24 SURGICAL SUPPLY — 26 items
BLADE SURG 15 STRL LF DISP TIS (BLADE) ×1 IMPLANT
BNDG ADH 1X3 SHEER STRL LF (GAUZE/BANDAGES/DRESSINGS) IMPLANT
BNDG COHESIVE 1X5 TAN STRL LF (GAUZE/BANDAGES/DRESSINGS) ×1 IMPLANT
COVER BACK TABLE 60X90IN (DRAPES) ×1 IMPLANT
COVER MAYO STAND STRL (DRAPES) ×1 IMPLANT
DRAPE LAPAROTOMY T 98X78 PEDS (DRAPES) ×1 IMPLANT
DRSG XEROFORM 1X8 (GAUZE/BANDAGES/DRESSINGS) IMPLANT
ELECTRODE REM PT RTRN 9FT ADLT (ELECTROSURGICAL) ×1 IMPLANT
GAUZE 4X4 16PLY ~~LOC~~+RFID DBL (SPONGE) ×1 IMPLANT
GAUZE SPONGE 4X4 12PLY STRL (GAUZE/BANDAGES/DRESSINGS) ×1 IMPLANT
GAUZE XEROFORM 1X8 LF (GAUZE/BANDAGES/DRESSINGS) ×1 IMPLANT
GLOVE BIO SURGEON STRL SZ 6.5 (GLOVE) ×1 IMPLANT
GOWN STRL REUS W/TWL LRG LVL3 (GOWN DISPOSABLE) ×1 IMPLANT
KIT BASIN OR (CUSTOM PROCEDURE TRAY) ×1 IMPLANT
KIT TURNOVER CYSTO (KITS) ×1 IMPLANT
NDL HYPO 22X1.5 SAFETY MO (MISCELLANEOUS) ×1 IMPLANT
NEEDLE HYPO 22X1.5 SAFETY MO (MISCELLANEOUS) ×1 IMPLANT
NS IRRIG 1000ML POUR BTL (IV SOLUTION) IMPLANT
PENCIL SMOKE EVACUATOR (MISCELLANEOUS) IMPLANT
SLEEVE SCD COMPRESS KNEE MED (STOCKING) ×1 IMPLANT
SUT CHROMIC 3 0 SH 27 (SUTURE) ×2 IMPLANT
SUT CHROMIC 4 0 SH 27 (SUTURE) ×1 IMPLANT
SUT VIC AB 3-0 SH 27X BRD (SUTURE) IMPLANT
SYR BULB EAR ULCER 3OZ GRN STR (SYRINGE) ×1 IMPLANT
SYR CONTROL 10ML LL (SYRINGE) IMPLANT
TOWEL GREEN STERILE FF (TOWEL DISPOSABLE) ×1 IMPLANT

## 2023-08-24 NOTE — Op Note (Signed)
 Preoperative diagnosis:  phimosis   Postoperative diagnosis:  Same   Procedure: Circumcision  Surgeon: Perley Bradley, MD  Anesthesia: General  Complications: None  Intraoperative findings: tight phimotic ring  EBL: Minimal  Specimens: None  Indication: Craig Wood is a 68 y.o. patient with phimosis and difficulty retracting foreskin  causing recurrent infections and/or poor hygiene.  After reviewing the management options for treatment, he elected to proceed with the above surgical procedure(s). We have discussed the potential benefits and risks of the procedure, side effects of the proposed treatment, the likelihood of the patient achieving the goals of the procedure, and any potential problems that might occur during the procedure or recuperation. Informed consent has been obtained.  Procedure:  After general anesthesia was induced the patient was prepped and draped in the routine sterile fashion. A penile block was then performed with local anesthetic. The foreskin was then opened enough to allow retraction over the glans penis. The prepuce was then marked so as to provide a proximately 5 mm collar.  The foreskin was then reduced over the glans penis and marked so as to allow a nice fit for the reanastomosis of the skin to the collar.  A 15 blade was then used to make a circumferential incision through the dermis of both marks. A pair of Metzenbaum scissors was then tunneled through the incisions and the foreskin opened on top of the scissors using electrocautery. The foreskin was then removed circumferentially. The underlying tissue was then cauterized and all bleeding stopped.  The penile skin was then approximated to the collar in 4 quadrants using a 3-0 chromic. The frenulum was attached with a U stitch in the dorsal surface. The remaining skin was then closed with interrupted 3-0 chromic sturues.  Vaseline impregnated dressing was then applied to the suture line in the penis  was wrapped with 2 inch Kerlix dressing gently. The patient was then given 12 dressing and a pair of mesh underpants. The patient was subsequently awoken and returned to PACU in excellent condition. There no immediate complications.

## 2023-08-24 NOTE — Anesthesia Postprocedure Evaluation (Signed)
 Anesthesia Post Note  Patient: Craig Wood  Procedure(s) Performed: CIRCUMCISION, ADULT (Penis)     Patient location during evaluation: PACU Anesthesia Type: General Level of consciousness: awake and alert Pain management: pain level controlled Vital Signs Assessment: post-procedure vital signs reviewed and stable Respiratory status: spontaneous breathing, nonlabored ventilation, respiratory function stable and patient connected to nasal cannula oxygen Cardiovascular status: blood pressure returned to baseline and stable Postop Assessment: no apparent nausea or vomiting Anesthetic complications: no   No notable events documented.  Last Vitals:  Vitals:   08/24/23 1334 08/24/23 1345  BP: 123/85 (!) 137/92  Pulse: 77 75  Resp: 19 17  Temp: 36.6 C   SpO2: 92% 92%    Last Pain:  Vitals:   08/24/23 1334  TempSrc:   PainSc: 0-No pain                 Leslye Rast

## 2023-08-24 NOTE — Discharge Instructions (Signed)
 Postoperative instructions for circumcision  Wound:  In most cases your incision will have absorbable sutures that run along the course of your incision and will dissolve within the first 10-20 days. Some will fall out even earlier. Expect some redness as the sutures dissolved but this should occur only around the sutures. If there is generalized redness, especially with increasing pain or swelling, let us  know. The penis will very likely get "black and blue" as the blood in the tissues spread. Sometimes the whole penis will turn colors. The black and blue is followed by a yellow and brown color. In time, all the discoloration will go away.   The dressing can be removed on Thursday. If it falls off before this it's okay. You can use vaseline around the suture line.   Diet:  You may return to your normal diet within 24 hours following your surgery. You may note some mild nausea and possibly vomiting the first 6-8 hours following surgery. This is usually due to the side effects of anesthesia, and will disappear quite soon. I would suggest clear liquids and a very light meal the first evening following your surgery.  Activity:  Your physical activity should be restricted the first 48 hours. During that time you should remain relatively inactive, moving about only when necessary. During the first 7-10 days following surgery he should avoid lifting any heavy objects (anything greater than 15 pounds), and avoid strenuous exercise. If you work, ask us  specifically about your restrictions, both for work and home. We will write a note to your employer if needed.  Ice packs can be placed on and off over the penis for the first 48 hours to help relieve the pain and keep the swelling down. Frozen peas or corn in a ZipLock bag can be frozen, used and re-frozen. Fifteen minutes on and 15 minutes off is a reasonable schedule.   Hygiene:  You may shower 48 hours after your surgery. Tub bathing should be  restricted until the seventh day.  Medication:  You will be sent home with some type of pain medication. In many cases you will be sent home with a narcotic pain pill (Vicodin or Tylox). If the pain is not too bad, you may take either Tylenol (acetaminophen) or Advil (ibuprofen) which contain no narcotic agents, and might be tolerated a little better, with fewer side effects. If the pain medication you are sent home with does not control the pain, you will have to let us  know. Some narcotic pain medications cannot be given or refilled by a phone call to a pharmacy.  Problems you should report to us :  Fever of 101.0 degrees Fahrenheit or greater. Moderate or severe swelling under the skin incision or involving the scrotum. Drug reaction such as hives, a rash, nausea or vomiting.

## 2023-08-24 NOTE — Interval H&P Note (Signed)
 History and Physical Interval Note:  08/24/2023 12:27 PM  Craig Wood  has presented today for surgery, with the diagnosis of PHIMOSIS.  The various methods of treatment have been discussed with the patient and family. After consideration of risks, benefits and other options for treatment, the patient has consented to  Procedure(s): CIRCUMCISION, ADULT (N/A) as a surgical intervention.  The patient's history has been reviewed, patient examined, no change in status, stable for surgery.  I have reviewed the patient's chart and labs.  Questions were answered to the patient's satisfaction.     Cormac Wint D Devynne Sturdivant

## 2023-08-24 NOTE — Transfer of Care (Signed)
 Immediate Anesthesia Transfer of Care Note  Patient: Craig Wood  Procedure(s) Performed: CIRCUMCISION, ADULT (Penis)  Patient Location: PACU  Anesthesia Type:General  Level of Consciousness: drowsy and patient cooperative  Airway & Oxygen Therapy: Patient Spontanous Breathing  Post-op Assessment: Post -op Vital signs reviewed and stable and Patient moving all extremities  Post vital signs: Reviewed and stable  Last Vitals:  Vitals Value Taken Time  BP 123/85 08/24/23 1334  Temp 36.6 C 08/24/23 1334  Pulse 73 08/24/23 1341  Resp 10 08/24/23 1341  SpO2 92 % 08/24/23 1341  Vitals shown include unfiled device data.  Last Pain:  Vitals:   08/24/23 1334  TempSrc:   PainSc: 0-No pain         Complications: No notable events documented.

## 2023-08-24 NOTE — Anesthesia Preprocedure Evaluation (Addendum)
 Anesthesia Evaluation  Patient identified by MRN, date of birth, ID band Patient awake    Reviewed: Allergy & Precautions, NPO status , Patient's Chart, lab work & pertinent test results, reviewed documented beta blocker date and time   History of Anesthesia Complications Negative for: history of anesthetic complications  Airway Mallampati: II  TM Distance: >3 FB     Dental no notable dental hx.    Pulmonary neg shortness of breath, neg pneumonia , neg COPD   breath sounds clear to auscultation       Cardiovascular Exercise Tolerance: Good  Rhythm:Regular Rate:Normal     Neuro/Psych neg Seizures    GI/Hepatic negative GI ROS, Neg liver ROS,,,  Endo/Other  negative endocrine ROS    Renal/GU negative Renal ROS     Musculoskeletal  (+) Arthritis , Osteoarthritis,    Abdominal   Peds  Hematology   Anesthesia Other Findings   Reproductive/Obstetrics                              Anesthesia Physical Anesthesia Plan  ASA: 2  Anesthesia Plan: General   Post-op Pain Management:    Induction: Intravenous  PONV Risk Score and Plan: 2  Airway Management Planned: LMA  Additional Equipment:   Intra-op Plan:   Post-operative Plan: Extubation in OR  Informed Consent: I have reviewed the patients History and Physical, chart, labs and discussed the procedure including the risks, benefits and alternatives for the proposed anesthesia with the patient or authorized representative who has indicated his/her understanding and acceptance.     Dental advisory given  Plan Discussed with: CRNA  Anesthesia Plan Comments:          Anesthesia Quick Evaluation

## 2023-08-24 NOTE — Anesthesia Procedure Notes (Signed)
 Procedure Name: LMA Insertion Date/Time: 08/24/2023 12:48 PM  Performed by: Trenton Frock, CRNAPre-anesthesia Checklist: Patient identified, Emergency Drugs available, Suction available and Patient being monitored Patient Re-evaluated:Patient Re-evaluated prior to induction Oxygen Delivery Method: Circle System Utilized Preoxygenation: Pre-oxygenation with 100% oxygen Induction Type: IV induction Ventilation: Mask ventilation without difficulty LMA: LMA inserted LMA Size: 4.0 and 5.0 Number of attempts: 1 Airway Equipment and Method: Bite block Placement Confirmation: positive ETCO2 and breath sounds checked- equal and bilateral Tube secured with: Tape Dental Injury: Teeth and Oropharynx as per pre-operative assessment

## 2023-08-25 ENCOUNTER — Encounter (HOSPITAL_COMMUNITY): Payer: Self-pay | Admitting: Urology

## 2023-08-25 LAB — SURGICAL PATHOLOGY

## 2023-09-07 DIAGNOSIS — N471 Phimosis: Secondary | ICD-10-CM | POA: Diagnosis not present

## 2023-10-18 DIAGNOSIS — N471 Phimosis: Secondary | ICD-10-CM | POA: Diagnosis not present

## 2023-12-17 DIAGNOSIS — M1712 Unilateral primary osteoarthritis, left knee: Secondary | ICD-10-CM | POA: Diagnosis not present

## 2023-12-28 DIAGNOSIS — M25762 Osteophyte, left knee: Secondary | ICD-10-CM | POA: Diagnosis not present

## 2023-12-28 DIAGNOSIS — M1712 Unilateral primary osteoarthritis, left knee: Secondary | ICD-10-CM | POA: Diagnosis not present

## 2023-12-30 DIAGNOSIS — M25561 Pain in right knee: Secondary | ICD-10-CM | POA: Diagnosis not present

## 2023-12-30 DIAGNOSIS — M25661 Stiffness of right knee, not elsewhere classified: Secondary | ICD-10-CM | POA: Diagnosis not present

## 2024-01-03 DIAGNOSIS — M25661 Stiffness of right knee, not elsewhere classified: Secondary | ICD-10-CM | POA: Diagnosis not present

## 2024-01-03 DIAGNOSIS — M25561 Pain in right knee: Secondary | ICD-10-CM | POA: Diagnosis not present

## 2024-01-05 DIAGNOSIS — M25561 Pain in right knee: Secondary | ICD-10-CM | POA: Diagnosis not present

## 2024-01-05 DIAGNOSIS — M25661 Stiffness of right knee, not elsewhere classified: Secondary | ICD-10-CM | POA: Diagnosis not present

## 2024-01-11 DIAGNOSIS — M25661 Stiffness of right knee, not elsewhere classified: Secondary | ICD-10-CM | POA: Diagnosis not present

## 2024-01-11 DIAGNOSIS — M25561 Pain in right knee: Secondary | ICD-10-CM | POA: Diagnosis not present

## 2024-01-14 DIAGNOSIS — M25662 Stiffness of left knee, not elsewhere classified: Secondary | ICD-10-CM | POA: Diagnosis not present

## 2024-01-14 DIAGNOSIS — M25562 Pain in left knee: Secondary | ICD-10-CM | POA: Diagnosis not present

## 2024-01-17 DIAGNOSIS — M25562 Pain in left knee: Secondary | ICD-10-CM | POA: Diagnosis not present

## 2024-01-17 DIAGNOSIS — M25662 Stiffness of left knee, not elsewhere classified: Secondary | ICD-10-CM | POA: Diagnosis not present

## 2024-01-19 DIAGNOSIS — M25562 Pain in left knee: Secondary | ICD-10-CM | POA: Diagnosis not present

## 2024-01-19 DIAGNOSIS — M25662 Stiffness of left knee, not elsewhere classified: Secondary | ICD-10-CM | POA: Diagnosis not present

## 2024-01-27 DIAGNOSIS — M25561 Pain in right knee: Secondary | ICD-10-CM | POA: Diagnosis not present

## 2024-01-27 DIAGNOSIS — M25661 Stiffness of right knee, not elsewhere classified: Secondary | ICD-10-CM | POA: Diagnosis not present

## 2024-02-08 DIAGNOSIS — M25561 Pain in right knee: Secondary | ICD-10-CM | POA: Diagnosis not present

## 2024-02-08 DIAGNOSIS — M25661 Stiffness of right knee, not elsewhere classified: Secondary | ICD-10-CM | POA: Diagnosis not present

## 2024-02-10 DIAGNOSIS — M25661 Stiffness of right knee, not elsewhere classified: Secondary | ICD-10-CM | POA: Diagnosis not present

## 2024-02-10 DIAGNOSIS — M25561 Pain in right knee: Secondary | ICD-10-CM | POA: Diagnosis not present

## 2024-02-15 DIAGNOSIS — M25661 Stiffness of right knee, not elsewhere classified: Secondary | ICD-10-CM | POA: Diagnosis not present

## 2024-02-15 DIAGNOSIS — M25561 Pain in right knee: Secondary | ICD-10-CM | POA: Diagnosis not present

## 2024-02-17 DIAGNOSIS — M25661 Stiffness of right knee, not elsewhere classified: Secondary | ICD-10-CM | POA: Diagnosis not present

## 2024-02-17 DIAGNOSIS — M25561 Pain in right knee: Secondary | ICD-10-CM | POA: Diagnosis not present

## 2024-03-08 DIAGNOSIS — F630 Pathological gambling: Secondary | ICD-10-CM | POA: Diagnosis not present

## 2024-03-22 DIAGNOSIS — F630 Pathological gambling: Secondary | ICD-10-CM | POA: Diagnosis not present

## 2024-03-22 DIAGNOSIS — F411 Generalized anxiety disorder: Secondary | ICD-10-CM | POA: Diagnosis not present

## 2024-03-30 DIAGNOSIS — F411 Generalized anxiety disorder: Secondary | ICD-10-CM | POA: Diagnosis not present

## 2024-03-30 DIAGNOSIS — F630 Pathological gambling: Secondary | ICD-10-CM | POA: Diagnosis not present
